# Patient Record
Sex: Male | Born: 1945 | Hispanic: No | Marital: Single | Smoking: Current every day smoker
Health system: Southern US, Community
[De-identification: ages and names within clinical notes are randomized; demographics above are authoritative.]

## PROBLEM LIST (undated history)

## (undated) DIAGNOSIS — I10 Essential (primary) hypertension: Secondary | ICD-10-CM

## (undated) DIAGNOSIS — E119 Type 2 diabetes mellitus without complications: Secondary | ICD-10-CM

## (undated) DIAGNOSIS — E7800 Pure hypercholesterolemia, unspecified: Secondary | ICD-10-CM

---

## 2008-08-11 HISTORY — PX: BACK SURGERY: SHX140

## 2015-02-14 ENCOUNTER — Emergency Department (HOSPITAL_COMMUNITY): Payer: No Typology Code available for payment source

## 2015-02-14 ENCOUNTER — Emergency Department (HOSPITAL_COMMUNITY)
Admission: EM | Admit: 2015-02-14 | Discharge: 2015-02-14 | Disposition: A | Discharge status: Home / Self Care | Payer: No Typology Code available for payment source | Attending: Emergency Medicine | Admitting: Emergency Medicine

## 2015-02-14 ENCOUNTER — Encounter (HOSPITAL_COMMUNITY): Payer: Self-pay | Admitting: Emergency Medicine

## 2015-02-14 DIAGNOSIS — S161XXA Strain of muscle, fascia and tendon at neck level, initial encounter: Secondary | ICD-10-CM | POA: Diagnosis not present

## 2015-02-14 DIAGNOSIS — Y9389 Activity, other specified: Secondary | ICD-10-CM | POA: Insufficient documentation

## 2015-02-14 DIAGNOSIS — S3992XA Unspecified injury of lower back, initial encounter: Secondary | ICD-10-CM | POA: Diagnosis present

## 2015-02-14 DIAGNOSIS — Y998 Other external cause status: Secondary | ICD-10-CM | POA: Insufficient documentation

## 2015-02-14 DIAGNOSIS — Z720 Tobacco use: Secondary | ICD-10-CM | POA: Diagnosis not present

## 2015-02-14 DIAGNOSIS — S39012A Strain of muscle, fascia and tendon of lower back, initial encounter: Secondary | ICD-10-CM

## 2015-02-14 DIAGNOSIS — I10 Essential (primary) hypertension: Secondary | ICD-10-CM | POA: Diagnosis not present

## 2015-02-14 DIAGNOSIS — I714 Abdominal aortic aneurysm, without rupture, unspecified: Secondary | ICD-10-CM

## 2015-02-14 DIAGNOSIS — Y92481 Parking lot as the place of occurrence of the external cause: Secondary | ICD-10-CM | POA: Insufficient documentation

## 2015-02-14 DIAGNOSIS — E119 Type 2 diabetes mellitus without complications: Secondary | ICD-10-CM | POA: Insufficient documentation

## 2015-02-14 HISTORY — DX: Pure hypercholesterolemia, unspecified: E78.00

## 2015-02-14 HISTORY — DX: Type 2 diabetes mellitus without complications: E11.9

## 2015-02-14 HISTORY — DX: Essential (primary) hypertension: I10

## 2015-02-14 MED ORDER — HYDROCODONE-ACETAMINOPHEN 5-325 MG PO TABS
2.0000 | ORAL_TABLET | Freq: Once | ORAL | Status: AC
  Administered 2015-02-14: 2 via ORAL
  Filled 2015-02-14: qty 2

## 2015-02-14 MED ORDER — HYDROCODONE-ACETAMINOPHEN 5-325 MG PO TABS: 1.0000 | ORAL_TABLET | Freq: Four times a day (QID) | ORAL | Status: AC | PRN
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
## 2015-02-14 NOTE — ED Notes (Signed)
Pt able to ambulate in room to dress self, denies dizziness, gait steady and even.
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
## 2016-04-19 IMAGING — CR DG LUMBAR SPINE 2-3V
3 series · 3 of 3 positions shown · non-contrast
Comparison: None.

CLINICAL DATA: Right side low back pain. No known injury. Initial
encounter.

EXAM:
LUMBAR SPINE - 2-3 VIEW

[l-spine ap]
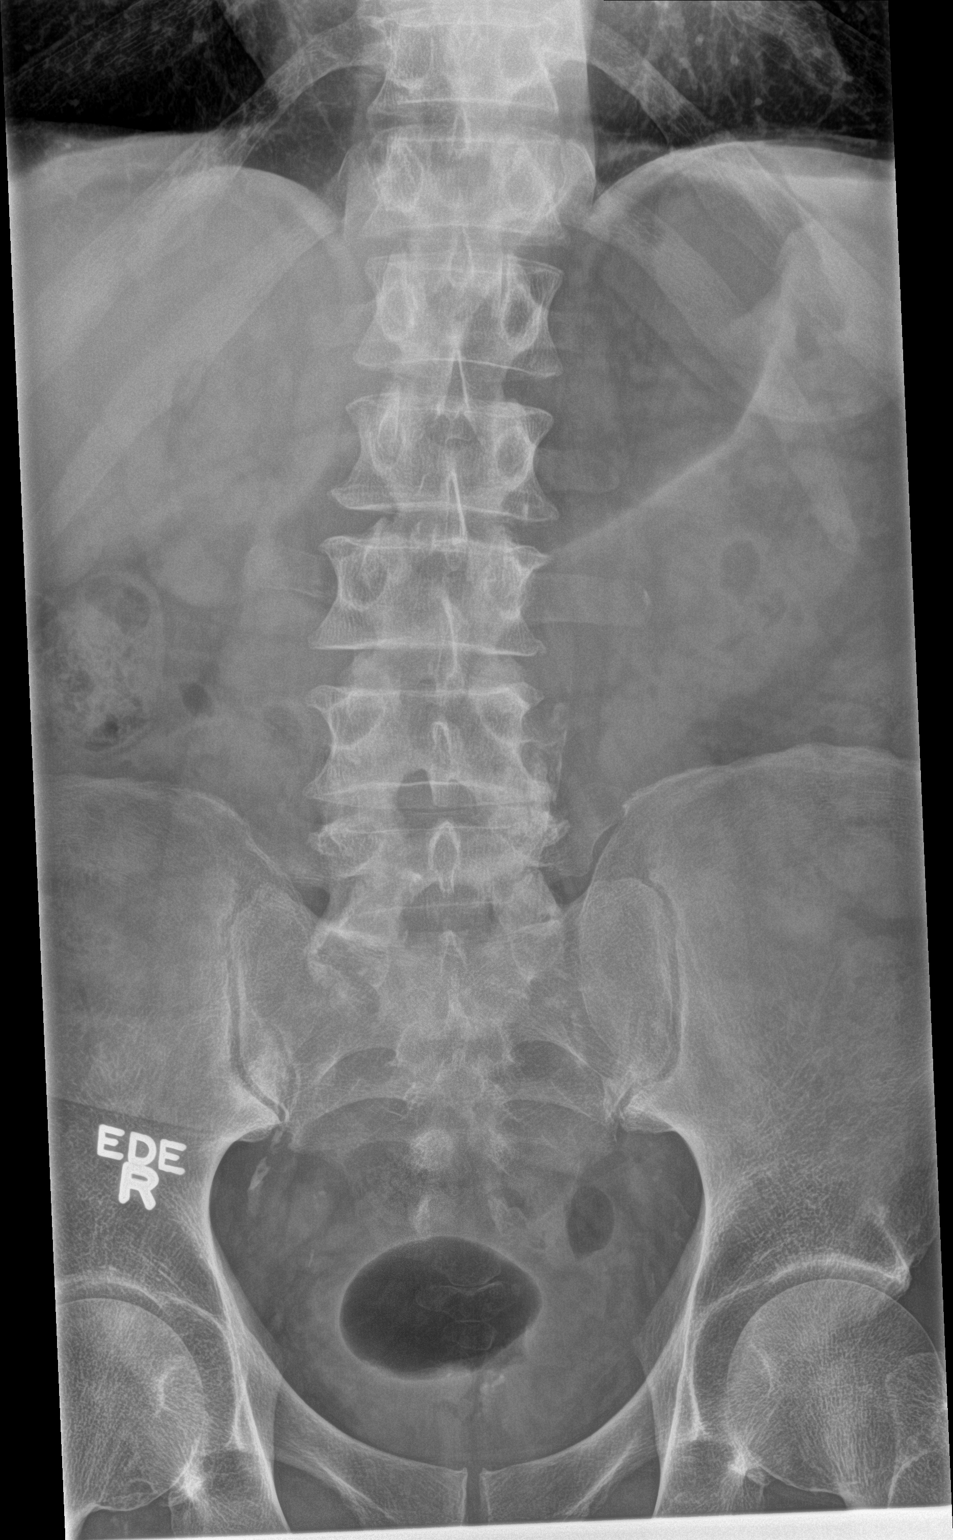

[l-spine lat]
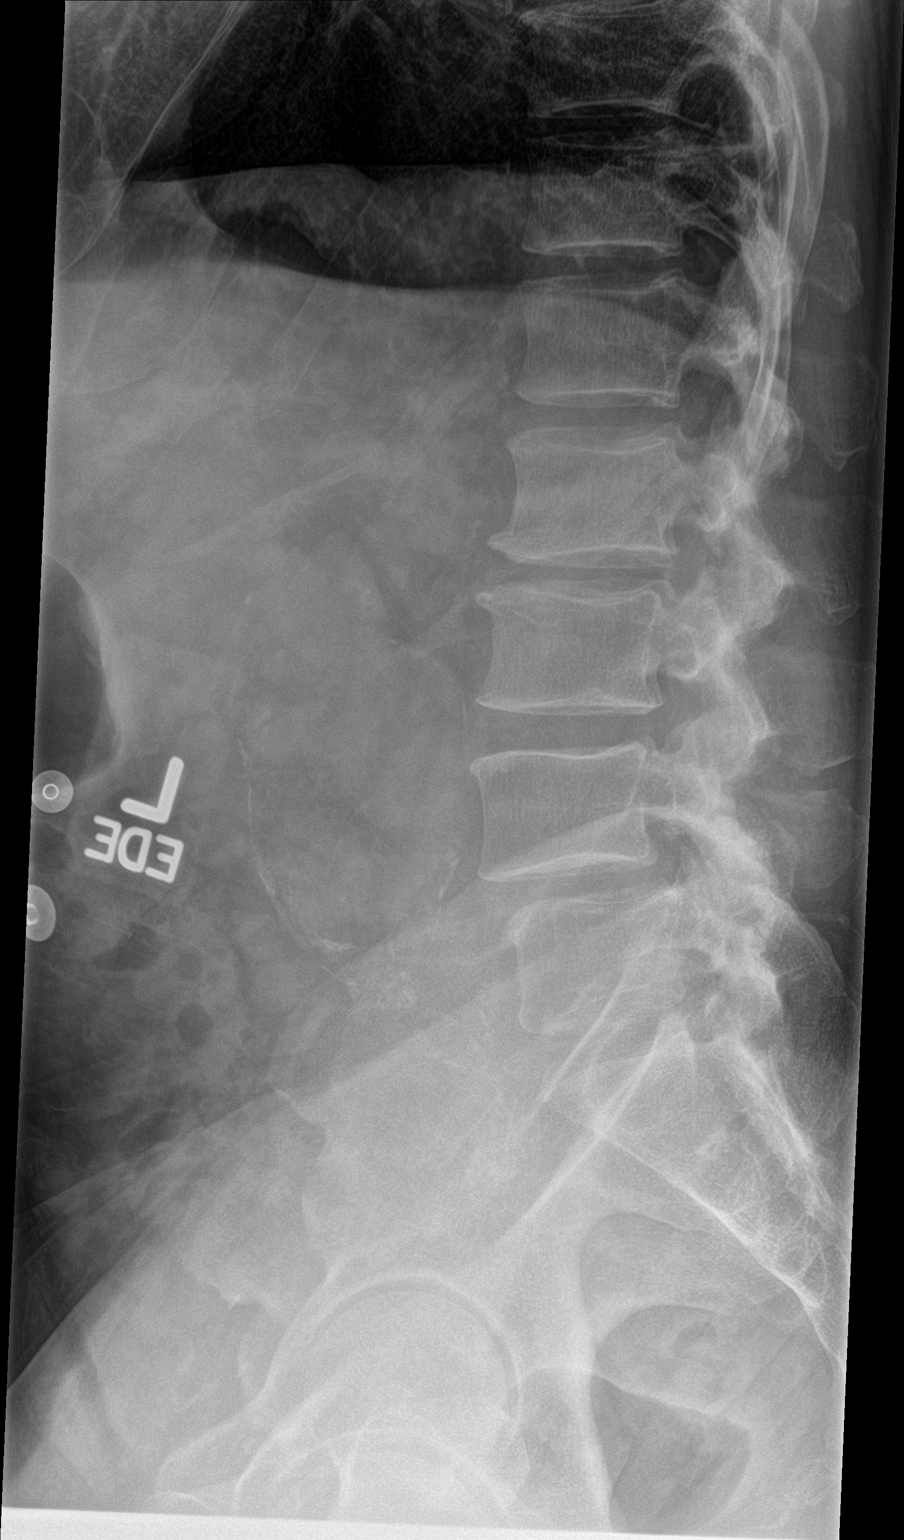

[l-spine spot]
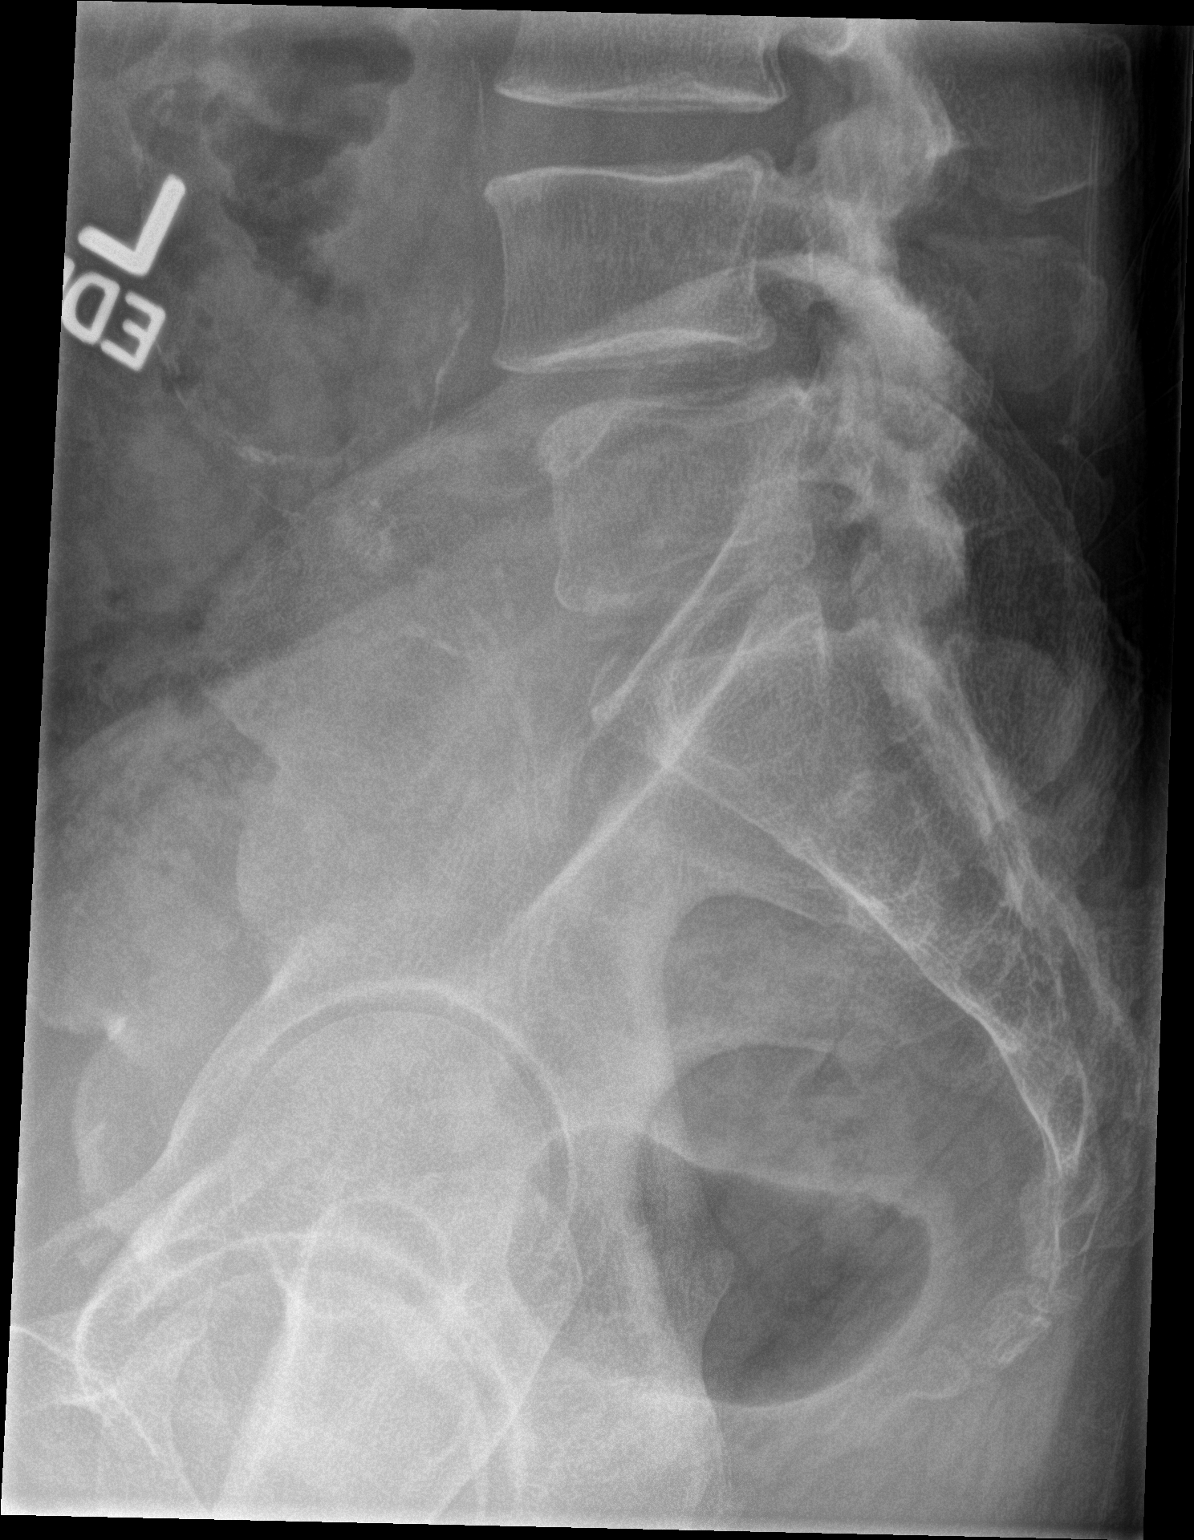

[3 of 3 positions shown; findings below may reference images not displayed]

FINDINGS: There is mild convex right scoliosis. 0.4 cm anterolisthesis L4 on
L5 due to facet arthropathy is noted. Scattered loss of disc space
height appears most notable at L2-3 and L4-5. The patient appears to
have a descending abdominal aortic aneurysm measuring 5.9 cm in
diameter. Imaged paraspinous structures are otherwise unremarkable.
IMPRESSION: Large appearing abdominal aortic aneurysm. Ultrasound is recommended
for further evaluation.

No acute abnormality lumbar spine.

Lumbar spondylosis most notable at L2-3 and L4-5.
